# Patient Record
Sex: Female | Born: 1965 | Race: White | Hispanic: No | Marital: Married | Smoking: Never smoker
Health system: Southern US, Community
[De-identification: ages and names within clinical notes are randomized; demographics above are authoritative.]

## PROBLEM LIST (undated history)

## (undated) DIAGNOSIS — J302 Other seasonal allergic rhinitis: Secondary | ICD-10-CM

## (undated) DIAGNOSIS — C801 Malignant (primary) neoplasm, unspecified: Secondary | ICD-10-CM

## (undated) HISTORY — PX: TUBAL LIGATION: SHX77

---

## 2006-04-12 ENCOUNTER — Ambulatory Visit: Payer: Self-pay | Admitting: Obstetrics and Gynecology

## 2007-04-30 ENCOUNTER — Ambulatory Visit: Payer: Self-pay | Admitting: Obstetrics and Gynecology

## 2008-05-04 ENCOUNTER — Ambulatory Visit: Payer: Self-pay | Admitting: Obstetrics and Gynecology

## 2009-08-24 ENCOUNTER — Ambulatory Visit: Payer: Self-pay

## 2010-11-08 ENCOUNTER — Ambulatory Visit: Payer: Self-pay | Admitting: Physician Assistant

## 2012-02-05 ENCOUNTER — Ambulatory Visit: Payer: Self-pay | Admitting: Physician Assistant

## 2013-04-02 ENCOUNTER — Ambulatory Visit: Payer: Self-pay | Admitting: Physician Assistant

## 2014-07-12 ENCOUNTER — Ambulatory Visit: Payer: Self-pay | Admitting: Physician Assistant

## 2016-09-11 ENCOUNTER — Other Ambulatory Visit: Payer: Self-pay | Admitting: Physician Assistant

## 2016-09-11 DIAGNOSIS — Z1231 Encounter for screening mammogram for malignant neoplasm of breast: Secondary | ICD-10-CM

## 2016-10-17 ENCOUNTER — Ambulatory Visit: Payer: Self-pay

## 2016-10-30 ENCOUNTER — Ambulatory Visit
Admission: RE | Admit: 2016-10-30 | Discharge: 2016-10-30 | Disposition: A | Discharge status: Home / Self Care | Payer: Managed Care, Other (non HMO) | Source: Ambulatory Visit | Attending: Physician Assistant | Admitting: Physician Assistant

## 2016-10-30 DIAGNOSIS — Z1231 Encounter for screening mammogram for malignant neoplasm of breast: Secondary | ICD-10-CM | POA: Diagnosis present

## 2016-10-30 HISTORY — DX: Malignant (primary) neoplasm, unspecified: C80.1

## 2017-01-04 ENCOUNTER — Encounter: Payer: Self-pay | Admitting: *Deleted

## 2017-01-07 ENCOUNTER — Encounter: Payer: Self-pay | Admitting: Anesthesiology

## 2017-01-07 ENCOUNTER — Ambulatory Visit: Payer: Managed Care, Other (non HMO) | Admitting: Anesthesiology

## 2017-01-07 ENCOUNTER — Encounter
Admission: RE | Disposition: A | Discharge status: Home / Self Care | Payer: Self-pay | Source: Ambulatory Visit | Attending: Unknown Physician Specialty

## 2017-01-07 ENCOUNTER — Ambulatory Visit
Admission: RE | Admit: 2017-01-07 | Discharge: 2017-01-07 | Disposition: A | Discharge status: Home / Self Care | Payer: Managed Care, Other (non HMO) | Source: Ambulatory Visit | Attending: Unknown Physician Specialty | Admitting: Unknown Physician Specialty

## 2017-01-07 DIAGNOSIS — K5730 Diverticulosis of large intestine without perforation or abscess without bleeding: Secondary | ICD-10-CM | POA: Diagnosis not present

## 2017-01-07 DIAGNOSIS — J302 Other seasonal allergic rhinitis: Secondary | ICD-10-CM | POA: Insufficient documentation

## 2017-01-07 DIAGNOSIS — Z1211 Encounter for screening for malignant neoplasm of colon: Secondary | ICD-10-CM | POA: Diagnosis not present

## 2017-01-07 DIAGNOSIS — K648 Other hemorrhoids: Secondary | ICD-10-CM | POA: Diagnosis not present

## 2017-01-07 HISTORY — PX: COLONOSCOPY WITH PROPOFOL: SHX5780

## 2017-01-07 HISTORY — DX: Other seasonal allergic rhinitis: J30.2

## 2017-01-07 SURGERY — COLONOSCOPY WITH PROPOFOL
Anesthesia: General

## 2017-01-07 MED ORDER — PROPOFOL 500 MG/50ML IV EMUL
INTRAVENOUS | Status: DC | PRN
  Administered 2017-01-07: 125 ug/kg/min via INTRAVENOUS

## 2017-01-07 MED ORDER — ONDANSETRON HCL 4 MG/2ML IJ SOLN: 4.0000 mg | Freq: Once | INTRAMUSCULAR | Status: DC | PRN

## 2017-01-07 MED ORDER — FENTANYL CITRATE (PF) 100 MCG/2ML IJ SOLN: 25.0000 ug | INTRAMUSCULAR | Status: DC | PRN

## 2017-01-07 MED ORDER — PROPOFOL 500 MG/50ML IV EMUL
INTRAVENOUS | Status: AC
  Filled 2017-01-07: qty 50

## 2017-01-07 MED ORDER — SODIUM CHLORIDE 0.9 % IV SOLN
INTRAVENOUS | Status: DC
  Administered 2017-01-07: 13:00:00 via INTRAVENOUS

## 2017-01-07 MED ORDER — PROPOFOL 10 MG/ML IV BOLUS
INTRAVENOUS | Status: DC | PRN
  Administered 2017-01-07: 50 mg via INTRAVENOUS
  Administered 2017-01-07 (×2): 30 mg via INTRAVENOUS
  Administered 2017-01-07: 50 mg via INTRAVENOUS

## 2017-01-07 MED ORDER — PROPOFOL 10 MG/ML IV BOLUS
INTRAVENOUS | Status: AC
  Filled 2017-01-07: qty 20

## 2017-01-07 MED ORDER — SODIUM CHLORIDE 0.9 % IV SOLN
INTRAVENOUS | Status: DC
  Administered 2017-01-07: 1000 mL via INTRAVENOUS

## 2017-01-07 NOTE — Anesthesia Post-op Follow-up Note (Cosign Needed)
Anesthesia QCDR form completed.        

## 2017-01-07 NOTE — Anesthesia Postprocedure Evaluation (Signed)
Anesthesia Post Note  Patient: Paula Gutierrez  Procedure(s) Performed: Procedure(s) (LRB): COLONOSCOPY WITH PROPOFOL (N/A)  Patient location during evaluation: Endoscopy Anesthesia Type: General Level of consciousness: awake and alert and oriented Pain management: pain level controlled Vital Signs Assessment: post-procedure vital signs reviewed and stable Respiratory status: spontaneous breathing, nonlabored ventilation and respiratory function stable Cardiovascular status: blood pressure returned to baseline and stable Postop Assessment: no signs of nausea or vomiting Anesthetic complications: no     Last Vitals:  Vitals:   01/07/17 1324 01/07/17 1334  BP: (!) 100/58 (!) 98/51  Pulse: 66 (!) 58  Resp: (!) 22 13  Temp:      Last Pain:  Vitals:   01/07/17 1304  TempSrc: Tympanic                 [name]

## 2017-01-07 NOTE — H&P (Signed)
   Primary Care Physician:  Marinda Elk, MD Primary Gastroenterologist:  Dr. Vira Agar  Pre-Procedure History & Physical: HPI:  Paula Gutierrez is a 51 y.o. female is here for an colonoscopy.   Past Medical History:  Diagnosis Date  . Cancer ([place])    basal cell  . Chronic seasonal allergic rhinitis     Past Surgical History:  Procedure Laterality Date  . TUBAL LIGATION      Prior to Admission medications   Medication Sig Start Date End Date Taking? Authorizing Provider  cholecalciferol (VITAMIN D) 400 units TABS tablet Take 1,000 Units by mouth.   Yes Historical Provider, MD  KRILL OIL PO Take by mouth daily.   Yes Historical Provider, MD  loratadine (CLARITIN) 10 MG tablet Take 10 mg by mouth daily.   Yes Historical Provider, MD  vitamin B-12 (CYANOCOBALAMIN) 1000 MCG tablet Take 1,000 mcg by mouth daily.   Yes Historical Provider, MD    Allergies as of 12/14/2016  . (Not on File)    Family History  Problem Relation Age of Onset  . Breast cancer Paternal Aunt 68    Social History   Social History  . Marital status: Married    Spouse name: N/A  . Number of children: N/A  . Years of education: N/A   Occupational History  . Not on file.   Social History Main Topics  . Smoking status: Never Smoker  . Smokeless tobacco: Never Used  . Alcohol use No  . Drug use: Unknown  . Sexual activity: Not on file   Other Topics Concern  . Not on file   Social History Narrative  . No narrative on file    Review of Systems: See HPI, otherwise negative ROS  Physical Exam: BP (!) 98/46   Pulse 76   Temp 98.1 F (36.7 C) (Tympanic)   Resp 16   Ht 5' 3.5" (1.613 m)   Wt 54 kg (119 lb)   SpO2 100%   BMI 20.75 kg/m  General:   Alert,  pleasant and cooperative in NAD Head:  Normocephalic and atraumatic. Neck:  Supple; no masses or thyromegaly. Lungs:  Clear throughout to auscultation.    Heart:  Regular rate and rhythm. Abdomen:  Soft, nontender and  nondistended. Normal bowel sounds, without guarding, and without rebound.   Neurologic:  Alert and  oriented x4;  grossly normal neurologically.  Impression/Plan: Paula Gutierrez is here for an colonoscopy to be performed for screening colonoscopy  Risks, benefits, limitations, and alternatives regarding  colonoscopy have been reviewed with the patient.  Questions have been answered.  All parties agreeable.   Gaylyn Cheers, MD  01/07/2017, 12:37 PM

## 2017-01-07 NOTE — Transfer of Care (Signed)
Immediate Anesthesia Transfer of Care Note  Patient: Paula Gutierrez  Procedure(s) Performed: Procedure(s): COLONOSCOPY WITH PROPOFOL (N/A)  Patient Location: PACU  Anesthesia Type:General  Level of Consciousness: awake, alert , oriented and patient cooperative  Airway & Oxygen Therapy: Patient Spontanous Breathing  Post-op Assessment: Report given to RN and Post -op Vital signs reviewed and stable  Post vital signs: Reviewed and stable  Last Vitals:  Vitals:   01/07/17 1202 01/07/17 1304  BP: (!) 98/46 (!) 94/55  Pulse: 76 75  Resp: 16 20  Temp: 36.7 C 36.4 C    Last Pain:  Vitals:   01/07/17 1304  TempSrc: Tympanic         Complications: No apparent anesthesia complications

## 2017-01-07 NOTE — Anesthesia Preprocedure Evaluation (Addendum)
Anesthesia Evaluation  Patient identified by MRN, date of birth, ID band Patient awake    Reviewed: Allergy & Precautions, NPO status , Patient's Chart, lab work & pertinent test results  Airway Mallampati: I  TM Distance: >3 FB     Dental no notable dental hx.    Pulmonary  Chronic seasonal allergies   Pulmonary exam normal        Cardiovascular negative cardio ROS Normal cardiovascular exam     Neuro/Psych negative neurological ROS  negative psych ROS   GI/Hepatic negative GI ROS, Neg liver ROS,   Endo/Other  negative endocrine ROS  Renal/GU negative Renal ROS  negative genitourinary   Musculoskeletal negative musculoskeletal ROS (+)   Abdominal Normal abdominal exam  (+)   Peds negative pediatric ROS (+)  Hematology negative hematology ROS (+)   Anesthesia Other Findings   Reproductive/Obstetrics negative OB ROS                            Anesthesia Physical Anesthesia Plan  ASA: I  Anesthesia Plan: General   Post-op Pain Management:    Induction: Intravenous  Airway Management Planned: Nasal Cannula  Additional Equipment:   Intra-op Plan:   Post-operative Plan:   Informed Consent: I have reviewed the patients History and Physical, chart, labs and discussed the procedure including the risks, benefits and alternatives for the proposed anesthesia with the patient or authorized representative who has indicated his/her understanding and acceptance.   Dental advisory given  Plan Discussed with: CRNA and Surgeon  Anesthesia Plan Comments:        Anesthesia Quick Evaluation

## 2017-01-07 NOTE — Op Note (Signed)
[facility] Gastroenterology Patient Name: Paula Gutierrez Procedure Date: 01/07/2017 12:03 PM MRN: [id number] Account #: [id number] Date of Birth: [birth year] Admit Type: Outpatient Age: 51 Room: [facility] ENDO ROOM 1 Gender: Female Note Status: Finalized Procedure:            Colonoscopy Indications:          Screening for colorectal malignant neoplasm Providers:            Manya Silvas, MD Referring MD:         Precious Bard, MD (Referring MD) Medicines:            Propofol per Anesthesia Complications:        No immediate complications. Procedure:            Pre-Anesthesia Assessment:                       - After reviewing the risks and benefits, the patient                        was deemed in satisfactory condition to undergo the                        procedure.                       After obtaining informed consent, the colonoscope was                        passed under direct vision. Throughout the procedure,                        the patient's blood pressure, pulse, and oxygen                        saturations were monitored continuously. The                        Colonoscope was introduced through the anus and                        advanced to the the cecum, identified by appendiceal                        orifice and ileocecal valve. The colonoscopy was                        performed without difficulty. The patient tolerated the                        procedure well. The quality of the bowel preparation                        was excellent. Findings:      Many small-mouthed diverticula were found in the sigmoid colon.      Internal hemorrhoids were found during endoscopy. The hemorrhoids were       small and Grade I (internal hemorrhoids that do not prolapse).      The exam was otherwise without abnormality. Impression:           - Diverticulosis in the sigmoid colon.                       -  Internal hemorrhoids.                       -  The examination was otherwise normal.                       - No specimens collected. Recommendation:       - Repeat colonoscopy in 10 years for screening purposes. Manya Silvas, MD 01/07/2017 1:00:59 PM This report has been signed electronically. Number of Addenda: 0 Note Initiated On: 01/07/2017 12:03 PM Scope Withdrawal Time: 0 hours 8 minutes 45 seconds  Total Procedure Duration: 0 hours 16 minutes 7 seconds       [name] [facility]

## 2017-01-08 ENCOUNTER — Encounter: Payer: Self-pay | Admitting: Unknown Physician Specialty

## 2017-10-14 ENCOUNTER — Other Ambulatory Visit: Payer: Self-pay | Admitting: Physician Assistant

## 2017-10-14 DIAGNOSIS — Z1231 Encounter for screening mammogram for malignant neoplasm of breast: Secondary | ICD-10-CM

## 2017-11-26 ENCOUNTER — Ambulatory Visit
Admission: RE | Admit: 2017-11-26 | Discharge: 2017-11-26 | Disposition: A | Discharge status: Home / Self Care | Payer: Managed Care, Other (non HMO) | Source: Ambulatory Visit | Attending: Physician Assistant | Admitting: Physician Assistant

## 2017-11-26 DIAGNOSIS — Z1231 Encounter for screening mammogram for malignant neoplasm of breast: Secondary | ICD-10-CM | POA: Diagnosis present

## 2019-05-12 ENCOUNTER — Other Ambulatory Visit: Payer: Self-pay | Admitting: Physician Assistant

## 2019-05-12 DIAGNOSIS — Z1231 Encounter for screening mammogram for malignant neoplasm of breast: Secondary | ICD-10-CM

## 2019-06-18 ENCOUNTER — Ambulatory Visit
Admission: RE | Admit: 2019-06-18 | Discharge: 2019-06-18 | Disposition: A | Discharge status: Home / Self Care | Payer: Managed Care, Other (non HMO) | Source: Ambulatory Visit | Attending: Physician Assistant | Admitting: Physician Assistant

## 2019-06-18 DIAGNOSIS — Z1231 Encounter for screening mammogram for malignant neoplasm of breast: Secondary | ICD-10-CM

## 2020-08-11 ENCOUNTER — Other Ambulatory Visit: Payer: Self-pay | Admitting: Physician Assistant

## 2021-01-19 ENCOUNTER — Other Ambulatory Visit: Payer: Self-pay | Admitting: Physician Assistant

## 2021-01-19 DIAGNOSIS — Z1231 Encounter for screening mammogram for malignant neoplasm of breast: Secondary | ICD-10-CM

## 2021-03-13 ENCOUNTER — Ambulatory Visit
Admission: RE | Admit: 2021-03-13 | Discharge: 2021-03-13 | Disposition: A | Discharge status: Home / Self Care | Payer: No Typology Code available for payment source | Source: Ambulatory Visit | Attending: Physician Assistant | Admitting: Physician Assistant

## 2021-03-13 ENCOUNTER — Other Ambulatory Visit: Payer: Self-pay

## 2021-03-13 DIAGNOSIS — Z1231 Encounter for screening mammogram for malignant neoplasm of breast: Secondary | ICD-10-CM | POA: Insufficient documentation

## 2021-12-16 IMAGING — MG MM DIGITAL SCREENING BILAT W/ TOMO AND CAD
8 series · 9 of 24 positions shown · non-contrast
Comparison: Previous exam(s).

CLINICAL DATA: Screening.

EXAM:
DIGITAL SCREENING BILATERAL MAMMOGRAM WITH TOMOSYNTHESIS AND CAD
TECHNIQUE: Bilateral screening digital craniocaudal and mediolateral oblique
mammograms were obtained. Bilateral screening digital breast
tomosynthesis was performed. The images were evaluated with
computer-aided detection.

[R CC synth-2D]
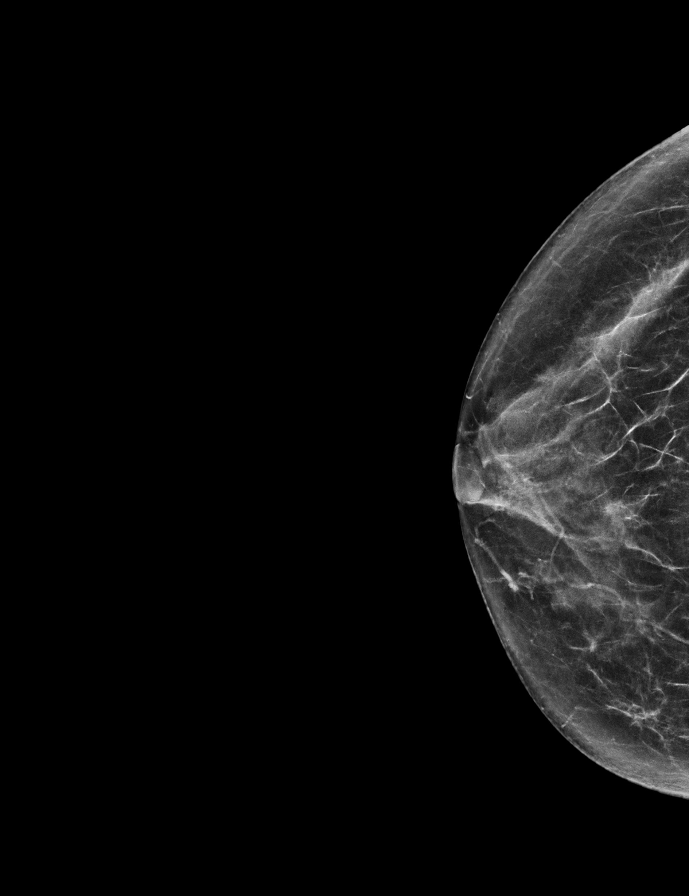

[L CC synth-2D]
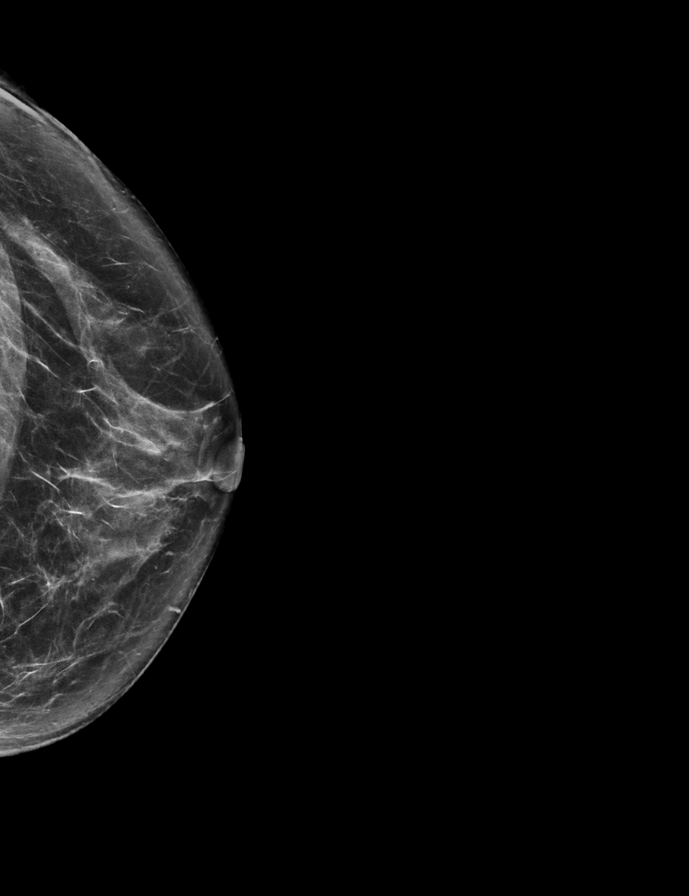

[R MLO synth-2D]
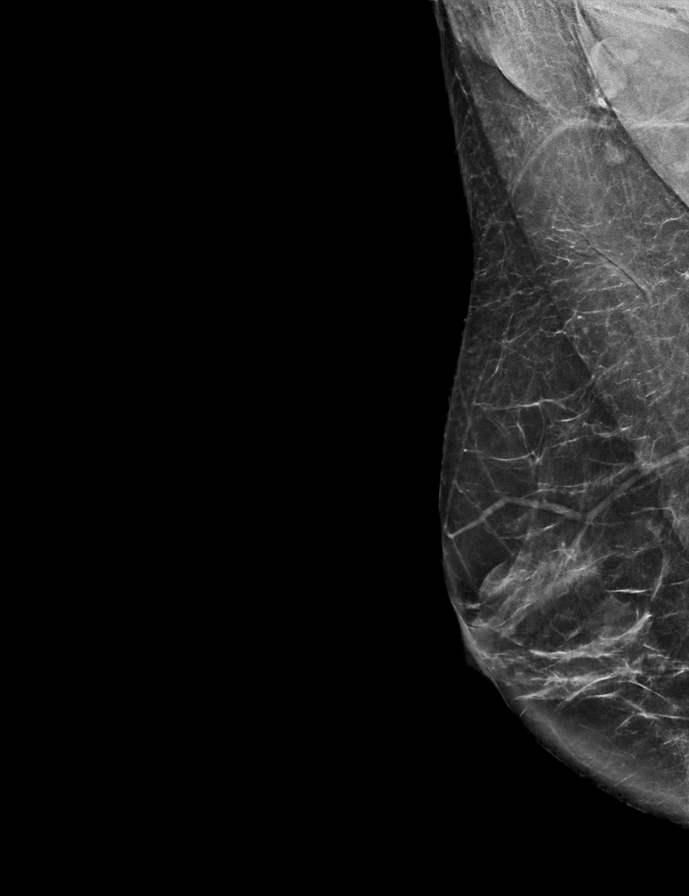

[L MLO synth-2D]
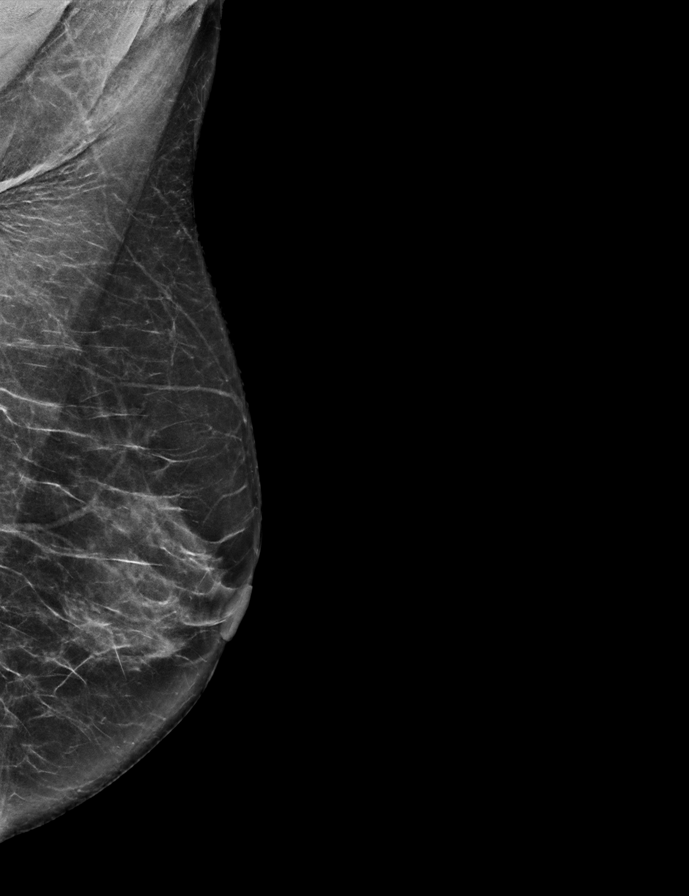

[R CC tomo · 2 of 60 frames shown]
[frame 20/60]
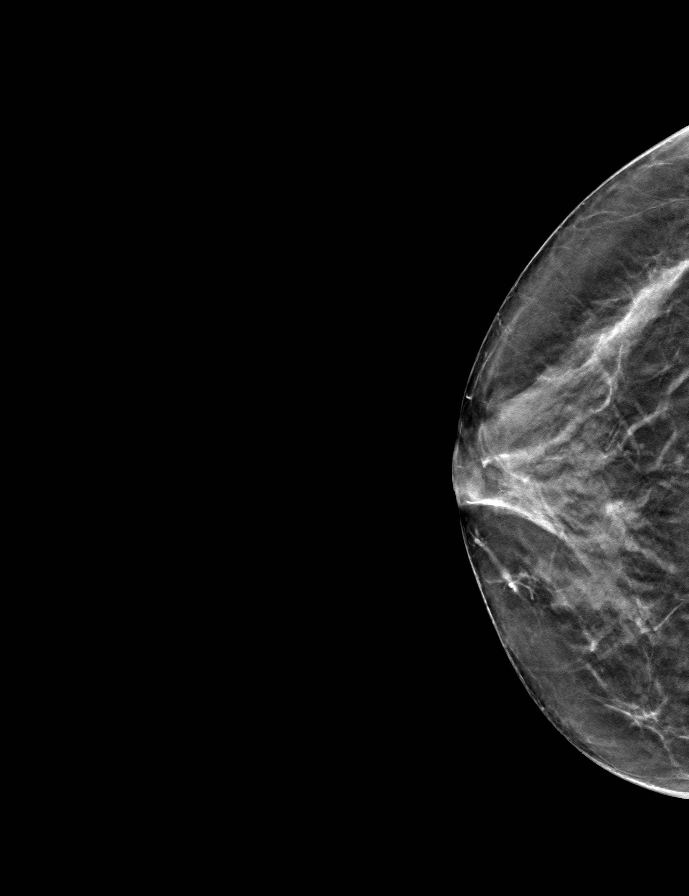
[frame 31/60]
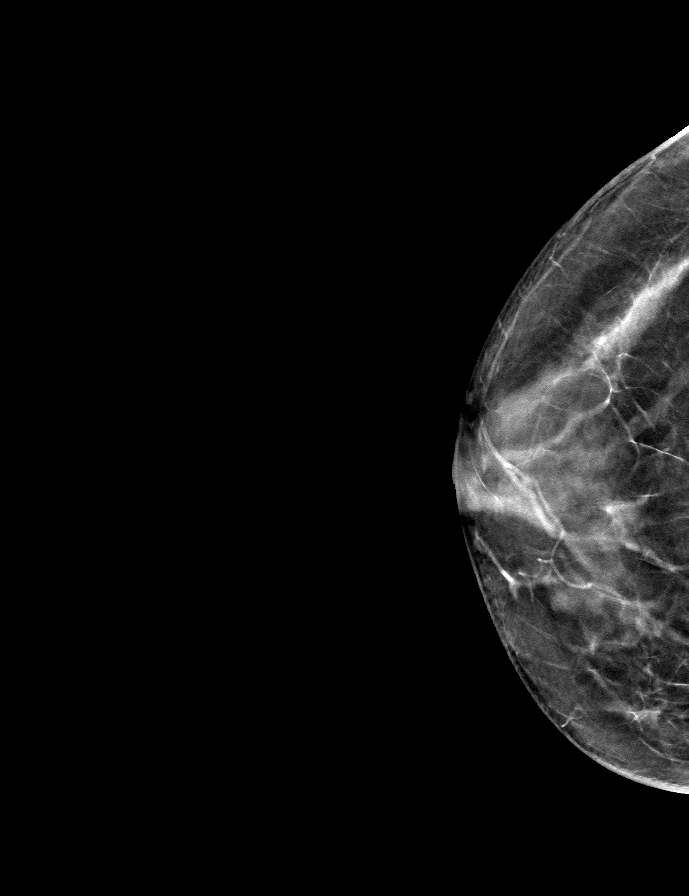

[L CC tomo · tomo slice 35/68.0]
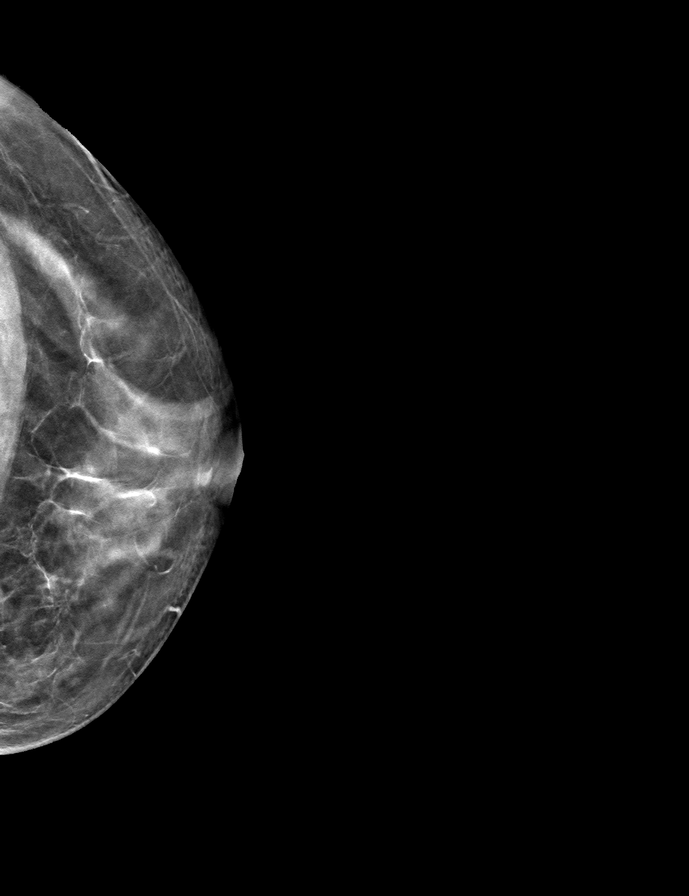

[R MLO tomo · tomo slice 35/68.0]
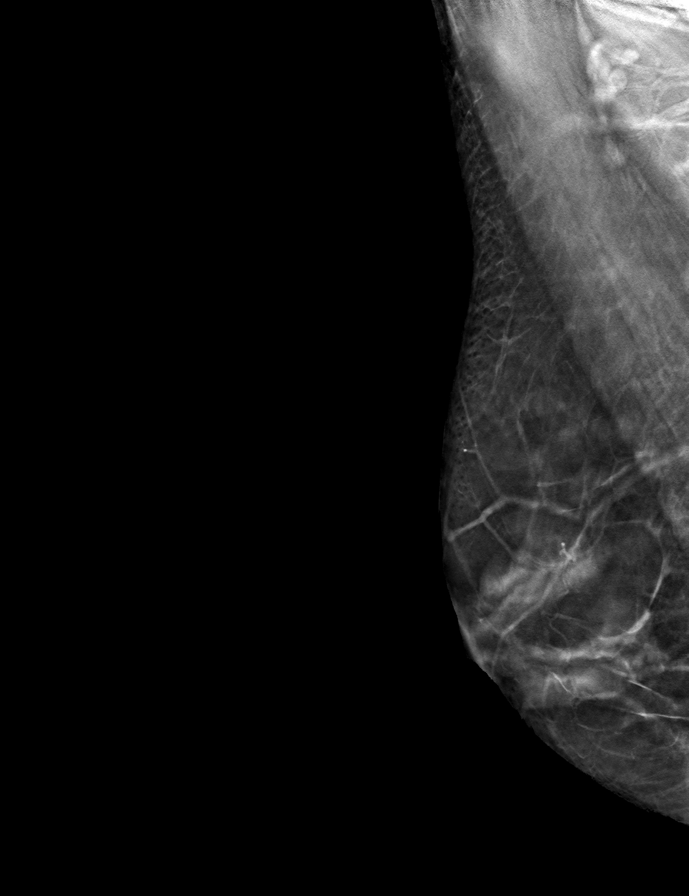

[L MLO tomo · tomo slice 33/64.0]
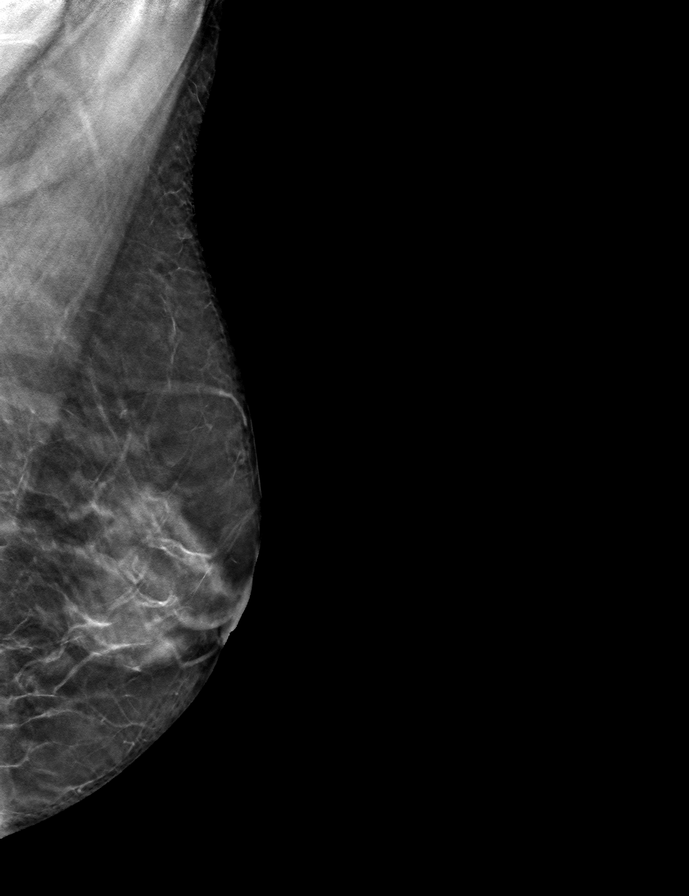

[9 of 24 positions shown; findings below may reference images not displayed]

ACR Breast Density Category c: The breast tissue is heterogeneously
dense, which may obscure small masses.
FINDINGS: There are no findings suspicious for malignancy. The images were
evaluated with computer-aided detection.
IMPRESSION: No mammographic evidence of malignancy. A result letter of this
screening mammogram will be mailed directly to the patient.

RECOMMENDATION:
Screening mammogram in one year. (Code:T4-5-GWO)

BI-RADS CATEGORY  1: Negative.

## 2022-05-15 ENCOUNTER — Other Ambulatory Visit: Payer: Self-pay | Admitting: Physician Assistant

## 2022-05-15 DIAGNOSIS — Z1231 Encounter for screening mammogram for malignant neoplasm of breast: Secondary | ICD-10-CM

## 2022-06-06 ENCOUNTER — Ambulatory Visit
Admission: RE | Admit: 2022-06-06 | Discharge: 2022-06-06 | Disposition: A | Discharge status: Home / Self Care | Payer: BC Managed Care – PPO | Source: Ambulatory Visit | Attending: Physician Assistant | Admitting: Physician Assistant

## 2022-06-06 DIAGNOSIS — Z1231 Encounter for screening mammogram for malignant neoplasm of breast: Secondary | ICD-10-CM | POA: Insufficient documentation

## 2022-06-12 ENCOUNTER — Other Ambulatory Visit: Payer: Self-pay | Admitting: Physician Assistant

## 2022-06-12 DIAGNOSIS — N6489 Other specified disorders of breast: Secondary | ICD-10-CM

## 2022-06-12 DIAGNOSIS — R928 Other abnormal and inconclusive findings on diagnostic imaging of breast: Secondary | ICD-10-CM

## 2022-06-20 ENCOUNTER — Ambulatory Visit
Admission: RE | Admit: 2022-06-20 | Discharge: 2022-06-20 | Disposition: A | Discharge status: Home / Self Care | Payer: BC Managed Care – PPO | Source: Ambulatory Visit | Attending: Physician Assistant | Admitting: Physician Assistant

## 2022-06-20 DIAGNOSIS — N6489 Other specified disorders of breast: Secondary | ICD-10-CM | POA: Insufficient documentation

## 2022-06-20 DIAGNOSIS — R928 Other abnormal and inconclusive findings on diagnostic imaging of breast: Secondary | ICD-10-CM | POA: Insufficient documentation

## 2023-06-05 ENCOUNTER — Other Ambulatory Visit: Payer: Self-pay | Admitting: Physician Assistant

## 2023-06-05 DIAGNOSIS — Z1231 Encounter for screening mammogram for malignant neoplasm of breast: Secondary | ICD-10-CM

## 2023-06-12 ENCOUNTER — Ambulatory Visit
Admission: RE | Admit: 2023-06-12 | Discharge: 2023-06-12 | Disposition: A | Discharge status: Home / Self Care | Payer: BC Managed Care – PPO | Source: Ambulatory Visit | Attending: Physician Assistant | Admitting: Physician Assistant

## 2023-06-12 DIAGNOSIS — Z1231 Encounter for screening mammogram for malignant neoplasm of breast: Secondary | ICD-10-CM | POA: Insufficient documentation

## 2024-06-23 ENCOUNTER — Other Ambulatory Visit: Payer: Self-pay | Admitting: Physician Assistant

## 2024-06-23 DIAGNOSIS — Z1231 Encounter for screening mammogram for malignant neoplasm of breast: Secondary | ICD-10-CM

## 2024-07-10 ENCOUNTER — Ambulatory Visit
Admission: RE | Admit: 2024-07-10 | Discharge: 2024-07-10 | Disposition: A | Discharge status: Home / Self Care | Source: Ambulatory Visit | Attending: Physician Assistant | Admitting: Physician Assistant

## 2024-07-10 DIAGNOSIS — Z1231 Encounter for screening mammogram for malignant neoplasm of breast: Secondary | ICD-10-CM | POA: Insufficient documentation
